# Patient Record
Sex: Female | Born: 1955 | Hispanic: Refuse to answer
Health system: Midwestern US, Community
[De-identification: ages and names within clinical notes are randomized; demographics above are authoritative.]

## PROBLEM LIST (undated history)

## (undated) DIAGNOSIS — L409 Psoriasis, unspecified: Secondary | ICD-10-CM

## (undated) DIAGNOSIS — R32 Unspecified urinary incontinence: Secondary | ICD-10-CM

---

## 2001-10-01 ENCOUNTER — Ambulatory Visit (HOSPITAL_COMMUNITY): Admission: RE | Admit: 2001-10-01 | Discharge: 2001-10-01 | Payer: Self-pay | Admitting: Obstetrics and Gynecology

## 2001-10-01 ENCOUNTER — Encounter: Payer: Self-pay | Admitting: Obstetrics and Gynecology

## 2001-12-01 ENCOUNTER — Other Ambulatory Visit: Admission: RE | Admit: 2001-12-01 | Discharge: 2001-12-01 | Payer: Self-pay | Admitting: Obstetrics and Gynecology

## 2002-04-20 ENCOUNTER — Other Ambulatory Visit: Admission: RE | Admit: 2002-04-20 | Discharge: 2002-04-20 | Payer: Self-pay | Admitting: Obstetrics and Gynecology

## 2002-10-11 ENCOUNTER — Encounter: Payer: Self-pay | Admitting: Obstetrics and Gynecology

## 2002-10-11 ENCOUNTER — Ambulatory Visit (HOSPITAL_COMMUNITY): Admission: RE | Admit: 2002-10-11 | Discharge: 2002-10-11 | Payer: Self-pay | Admitting: Obstetrics and Gynecology

## 2003-04-04 ENCOUNTER — Ambulatory Visit (HOSPITAL_COMMUNITY): Admission: RE | Admit: 2003-04-04 | Discharge: 2003-04-04 | Payer: Self-pay | Admitting: Obstetrics & Gynecology

## 2003-04-06 ENCOUNTER — Ambulatory Visit (HOSPITAL_COMMUNITY): Admission: RE | Admit: 2003-04-06 | Discharge: 2003-04-06 | Payer: Self-pay | Admitting: Obstetrics & Gynecology

## 2003-10-31 ENCOUNTER — Ambulatory Visit (HOSPITAL_COMMUNITY): Admission: RE | Admit: 2003-10-31 | Discharge: 2003-10-31 | Payer: Self-pay | Admitting: Obstetrics and Gynecology

## 2004-01-22 ENCOUNTER — Encounter: Admission: RE | Admit: 2004-01-22 | Discharge: 2004-01-22 | Payer: Self-pay | Admitting: Surgery

## 2004-12-10 ENCOUNTER — Ambulatory Visit (HOSPITAL_COMMUNITY): Admission: RE | Admit: 2004-12-10 | Discharge: 2004-12-10 | Payer: Self-pay | Admitting: Obstetrics and Gynecology

## 2006-02-17 ENCOUNTER — Ambulatory Visit (HOSPITAL_COMMUNITY): Admission: RE | Admit: 2006-02-17 | Discharge: 2006-02-17 | Payer: Self-pay | Admitting: Obstetrics and Gynecology

## 2006-10-27 ENCOUNTER — Encounter (INDEPENDENT_AMBULATORY_CARE_PROVIDER_SITE_OTHER): Payer: Self-pay | Admitting: Specialist

## 2006-10-27 ENCOUNTER — Ambulatory Visit (HOSPITAL_COMMUNITY): Admission: RE | Admit: 2006-10-27 | Discharge: 2006-10-27 | Payer: Self-pay | Admitting: General Surgery

## 2007-03-08 ENCOUNTER — Ambulatory Visit (HOSPITAL_COMMUNITY): Admission: RE | Admit: 2007-03-08 | Discharge: 2007-03-08 | Payer: Self-pay | Admitting: Obstetrics and Gynecology

## 2007-09-09 HISTORY — PX: HERNIA REPAIR: SHX51

## 2008-03-27 ENCOUNTER — Other Ambulatory Visit: Admission: RE | Admit: 2008-03-27 | Discharge: 2008-03-27 | Payer: Self-pay | Admitting: Obstetrics & Gynecology

## 2008-06-12 ENCOUNTER — Ambulatory Visit (HOSPITAL_COMMUNITY): Admission: RE | Admit: 2008-06-12 | Discharge: 2008-06-12 | Payer: Self-pay | Admitting: Obstetrics and Gynecology

## 2009-09-05 ENCOUNTER — Ambulatory Visit (HOSPITAL_COMMUNITY): Admission: RE | Admit: 2009-09-05 | Discharge: 2009-09-05 | Payer: Self-pay | Admitting: Obstetrics and Gynecology

## 2011-01-24 NOTE — H&P (Signed)
   Dana Clark, Dana Clark                            ACCOUNT NO.:  [id number]   MEDICAL RECORD NO.:  [id number]                   PATIENT TYPE:  AMB   LOCATION:  DAY                                  FACILITY:  [facility]   PHYSICIAN:  Lazaro Arms, M.D.                DATE OF BIRTH:  April 27, 1956   DATE OF ADMISSION:  04/04/2003  DATE OF DISCHARGE:                                HISTORY & PHYSICAL   HISTORY OF PRESENT ILLNESS:  The patient is a 55 year old white female  gravida 4, para 3, abortus 1 with last menstrual period of March 16, 2003 who  is admitted for hysteroscopy, D&C, and endometrial ablation because of  really heavy menstrual periods. Typically days 2 and 3 they are very heavy  with lots of clots. She uses tampons and pads together. She soils her  clothes and her sheets. Does not have much cramping with it but really can  not even go out of the house well. We discussed treatment options but at her  age, I was uncomfortable and she was uncomfortable with oral contraceptive  pills and we have admitted her for hysteroscopy, D&C, and ablation.   PAST MEDICAL HISTORY:  Negative.   PAST SURGICAL HISTORY:  Left shoulder.   PAST OBSTETRIC HISTORY:  Three vaginal deliveries and 1 miscarriage.   REVIEW OF SYSTEMS:  Otherwise negative.   PHYSICAL EXAMINATION:  VITAL SIGNS: Weight is 127 pounds. Hemoglobin 11.4.  Blood pressure is 100/78.  HEENT: Unremarkable.  NECK: Thyroid is normal.  LUNGS: Clear.  HEART: Regular rate and rhythm. Without murmur, rub, or gallop.  BREAST: Without mass, discharge, or skin changes.  ABDOMEN: Benign. No hepatosplenomegaly or masses.  PELVIC: She has normal external genitalia. Vagina clean without discharge.  Cervix parous __________ lesions. Uterus is normal size, shape, and contour.  Nontender. The ovaries normal size and nontender.   IMPRESSION:  Hypermenorrhea and menometrorrhagia.   PLAN:  The patient is admitted for hysteroscopy, D&C, and  endometrial  ablation. She understands the risks, benefits, indications and alternatives  and will proceed.                                               Lazaro Arms, M.D.    [name]  D:  04/05/2003  T:  04/05/2003  Job:  [id number]

## 2011-01-24 NOTE — Op Note (Signed)
NAMEEMBERLI, BALLESTER                  ACCOUNT NO.:  [id number]   MEDICAL RECORD NO.:  [id number]          PATIENT TYPE:  AMB   LOCATION:  DAY                          FACILITY:  [facility]   PHYSICIAN:  Ollen Gross. Vernell Morgans, M.D. DATE OF BIRTH:  [birth year]   DATE OF PROCEDURE:  10/27/2006  DATE OF DISCHARGE:                               OPERATIVE REPORT   PREOPERATIVE DIAGNOSIS:  Left inguinal hernia.   POSTOPERATIVE DIAGNOSIS:  Left indirect inguinal hernia.   PROCEDURE:  Left inguinal hernia repair with mesh.   SURGEON:  Ollen Gross. Vernell Morgans, M.D.   ANESTHESIA:  General via LMA.   PROCEDURE:  After informed consent was obtained, the patient was brought  to the operating room, placed in supine position on the operating table.  After adequate induction of general anesthesia, the patient's left groin  was infiltrated with 0.25% Marcaine.  A small incision was made from the  edge of the pubic tubercle on the left towards the anterior superior  iliac spine.  This incision was carried down through the skin and  subcutaneous tissue sharply with electrocautery until the fascia of the  external oblique was encountered.  The small bridging vein was clamped  with hemostats, divided and ligated with 3-0 silk ties.  The external  oblique fascia was opened along its fibers towards the apex of the  external ring with a 15 blade knife and Metzenbaum scissors.  Blunt  dissection was carried out of the round ligament until it could be  surrounded between two fingers.  The round ligament was gently  skeletonized and a hernia sac was identified with the round ligament.  It was gently separated from the round ligament with a blunt hemostat  dissection and some sharp dissection with electrocautery.  The sac was  then opened near its base.  There were no visceral contents within the  sac.  The sac was closed with a running 2-0 silk stitch and was allowed  to retract back beneath the transversalis muscular layer.   The distal  sac was sent to pathology the ilioinguinal nerve was also identified and  was clamped proximally and distally, divided and ligated with 3-0 silk  ties.  Next a piece of Parietex 3 x 6 mesh was chosen and cut to fit.  The mesh was sewed inferiorly to the shelving edge of inguinal ligament  with a running 2-0 Prolene stitch.  The tails were cut in the mesh  laterally and the tails were wrapped around the cord structures  superiorly.  The mesh was sewed to the muscular aponeurotic strength  layer of the transversalis with interrupted 2-0 Prolene vertical  mattress stitches.  The tails were anchored lateral to the round  ligament to the shelving edge of inguinal ligament with an interrupted 2-  0 Prolene stitch.  Once this was accomplished, the mesh appeared to be  in good position without any tension.  The wound was irrigated with  copious amounts of saline.  The external oblique was then reapproximated  with a running 2-0 Vicryl stitch.  The wound was  infiltrated with more  quarter percent Marcaine.  The subcutaneous fascia was closed with  running 3-0 Vicryl stitch  and the skin was closed with a running 4-0 Monocryl subcuticular stitch.  Benzoin, Steri-Strips and sterile dressings were applied.  The patient  tolerated procedure well.  At the end of the case, all needle, sponge  and instrument counts were correct.  The patient was awake and taken to  recovery in stable condition.      Ollen Gross. Vernell Morgans, M.D.  Electronically Signed     PST/MEDQ  D:  10/27/2006  T:  10/27/2006  Job:  [id number]

## 2011-01-24 NOTE — Op Note (Signed)
   Dana Clark, Dana Clark                            ACCOUNT NO.:  [id number]   MEDICAL RECORD NO.:  [id number]                   PATIENT TYPE:  AMB   LOCATION:  DAY                                  FACILITY:  [facility]   PHYSICIAN:  Lazaro Arms, M.D.                DATE OF BIRTH:  15-Oct-1955   DATE OF PROCEDURE:  04/06/2003  DATE OF DISCHARGE:                                 OPERATIVE REPORT   PREOPERATIVE DIAGNOSES:  1. Hypermenorrhea.  2. Menometrorrhagia.   POSTOPERATIVE DIAGNOSES:  1. Hypermenorrhea.  2. Menometrorrhagia.   PROCEDURE:  Hysteroscopy, dilatation and curettage, with endometrial  ablation.   SURGEON:  Lazaro Arms, M.D.   ANESTHESIA:  General endotracheal.   FINDINGS:  The patient had a relatively normal-appearing endometrium.  She  had no polyps.  I noted no submucosal myomas.  She did have a heavy growth  of endometrial tissue.   DESCRIPTION OF OPERATION:  The patient was taken to the operating room and  placed in the supine position, where she underwent general endotracheal  anesthesia.  She was then placed in the dorsal lithotomy position and  prepped and draped in the usual sterile fashion, and her bladder was  drained.  A speculum was placed.  Her cervix was grasped.  Paracervical  block using 0.5% Marcaine with 1:200,000 epinephrine was placed, and the  uterus sounded to 9 cm.  The cervix was dilated serially to allow passage of  the hysteroscope.  A hysteroscopy was performed, a diagnostic hysteroscopy  performed in the usual fashion.  The tubal ostia were seen.  The anterior  and posterior walls and the fundal portion of the uterus were seen well with  no abnormalities.  A vigorous uterine curettage was performed and a good  uterine cry in all areas. The ThermaChoice endometrial ablation was then  performed in the usual fashion.  It took 23 mL of D5W to inflate the balloon  of a pressure of around 190 mmHg.  It was heated to 87 degrees Celsius or  180 degrees Fahrenheit, and this pressure was maintained throughout and all  the fluid was recovered at the end of the procedure.  A total therapy time  of 9 minutes 40 seconds was done.  The patient was awakened from anesthesia  and taken to the recovery room in good, stable condition.  All counts  correct.                                               Lazaro Arms, M.D.    [name]  D:  04/06/2003  T:  04/06/2003  Job:  [id number]

## 2011-02-07 ENCOUNTER — Other Ambulatory Visit: Payer: Self-pay | Admitting: Dermatology

## 2012-06-14 ENCOUNTER — Other Ambulatory Visit: Payer: Self-pay | Admitting: Dermatology

## 2012-10-27 ENCOUNTER — Other Ambulatory Visit: Payer: Self-pay | Admitting: Obstetrics and Gynecology

## 2012-10-27 ENCOUNTER — Other Ambulatory Visit (HOSPITAL_COMMUNITY)
Admission: RE | Admit: 2012-10-27 | Discharge: 2012-10-27 | Disposition: A | Discharge status: Home / Self Care | Payer: BC Managed Care – PPO | Source: Ambulatory Visit | Attending: Obstetrics and Gynecology | Admitting: Obstetrics and Gynecology

## 2012-10-27 DIAGNOSIS — Z01419 Encounter for gynecological examination (general) (routine) without abnormal findings: Secondary | ICD-10-CM | POA: Insufficient documentation

## 2012-10-27 DIAGNOSIS — Z1151 Encounter for screening for human papillomavirus (HPV): Secondary | ICD-10-CM | POA: Insufficient documentation

## 2012-12-15 ENCOUNTER — Encounter: Payer: Self-pay | Admitting: Obstetrics and Gynecology

## 2013-06-13 ENCOUNTER — Other Ambulatory Visit: Payer: Self-pay | Admitting: Obstetrics and Gynecology

## 2013-06-13 DIAGNOSIS — Z139 Encounter for screening, unspecified: Secondary | ICD-10-CM

## 2013-06-27 ENCOUNTER — Ambulatory Visit (HOSPITAL_COMMUNITY)
Admission: RE | Admit: 2013-06-27 | Discharge: 2013-06-27 | Disposition: A | Discharge status: Home / Self Care | Payer: BC Managed Care – PPO | Source: Ambulatory Visit | Attending: Obstetrics and Gynecology | Admitting: Obstetrics and Gynecology

## 2013-06-27 DIAGNOSIS — Z139 Encounter for screening, unspecified: Secondary | ICD-10-CM

## 2013-06-27 DIAGNOSIS — Z1231 Encounter for screening mammogram for malignant neoplasm of breast: Secondary | ICD-10-CM | POA: Insufficient documentation

## 2013-07-04 ENCOUNTER — Other Ambulatory Visit: Payer: Self-pay | Admitting: Obstetrics and Gynecology

## 2013-07-04 DIAGNOSIS — R921 Mammographic calcification found on diagnostic imaging of breast: Secondary | ICD-10-CM

## 2013-07-06 ENCOUNTER — Ambulatory Visit (HOSPITAL_COMMUNITY)
Admission: RE | Admit: 2013-07-06 | Discharge: 2013-07-06 | Disposition: A | Discharge status: Home / Self Care | Payer: BC Managed Care – PPO | Source: Ambulatory Visit | Attending: Obstetrics and Gynecology | Admitting: Obstetrics and Gynecology

## 2013-07-06 ENCOUNTER — Encounter (HOSPITAL_COMMUNITY): Payer: BC Managed Care – PPO

## 2013-07-06 ENCOUNTER — Other Ambulatory Visit: Payer: Self-pay | Admitting: Obstetrics and Gynecology

## 2013-07-06 DIAGNOSIS — R921 Mammographic calcification found on diagnostic imaging of breast: Secondary | ICD-10-CM

## 2013-07-06 DIAGNOSIS — R928 Other abnormal and inconclusive findings on diagnostic imaging of breast: Secondary | ICD-10-CM | POA: Insufficient documentation

## 2013-07-11 ENCOUNTER — Ambulatory Visit
Admission: RE | Admit: 2013-07-11 | Discharge: 2013-07-11 | Disposition: A | Discharge status: Home / Self Care | Payer: BC Managed Care – PPO | Source: Ambulatory Visit | Attending: Obstetrics and Gynecology | Admitting: Obstetrics and Gynecology

## 2013-07-11 ENCOUNTER — Telehealth: Payer: Self-pay | Admitting: Adult Health

## 2013-07-11 DIAGNOSIS — R921 Mammographic calcification found on diagnostic imaging of breast: Secondary | ICD-10-CM

## 2013-07-11 NOTE — Telephone Encounter (Signed)
Dana Clark had breast biopsy today, off her hormones having headache, will stay off hormones til gets results.she has lots of questions, told her to wait til results back, then can answer better.

## 2013-07-12 ENCOUNTER — Telehealth: Payer: Self-pay | Admitting: Obstetrics and Gynecology

## 2013-07-12 NOTE — Telephone Encounter (Signed)
Patient's questions answered.  She has appt with Dr Carolynne Edouard later this week for excisional biopsy.

## 2013-07-26 ENCOUNTER — Encounter (INDEPENDENT_AMBULATORY_CARE_PROVIDER_SITE_OTHER): Payer: Self-pay | Admitting: General Surgery

## 2013-07-26 ENCOUNTER — Encounter (INDEPENDENT_AMBULATORY_CARE_PROVIDER_SITE_OTHER): Payer: Self-pay

## 2013-07-26 ENCOUNTER — Ambulatory Visit (INDEPENDENT_AMBULATORY_CARE_PROVIDER_SITE_OTHER): Payer: BC Managed Care – PPO | Admitting: General Surgery

## 2013-07-26 VITALS — BP 104/62 | HR 76 | Resp 16 | Ht 67.0 in | Wt 129.0 lb

## 2013-07-26 DIAGNOSIS — N6099 Unspecified benign mammary dysplasia of unspecified breast: Secondary | ICD-10-CM | POA: Insufficient documentation

## 2013-07-26 DIAGNOSIS — N6089 Other benign mammary dysplasias of unspecified breast: Secondary | ICD-10-CM

## 2013-07-27 ENCOUNTER — Encounter (INDEPENDENT_AMBULATORY_CARE_PROVIDER_SITE_OTHER): Payer: Self-pay | Admitting: General Surgery

## 2013-07-27 NOTE — Progress Notes (Signed)
Patient ID: Dana Clark, female   DOB: 1956-04-25, 57 y.o.   MRN: [id number]  Chief Complaint  Patient presents with  . Breast Mass    HPI Dana Clark is a 57 y.o. female.  We are asked to see the pt in consultation by Dr. Si Gaul to evaluate her for abnormal calcifications in the left breast. The pt is a 57yo wf who recently went for a routine screening mammogram. At that time she was found to have abnormal appearing calcs in the lateral aspect of the left breast measuring about 5cm. 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 Prior to her biopsy she denied breast pain or discharge from her nipple. She did take estrogen and progesterone for a while but is now off this. Her only family history of breast cancer is in a maternal grandmother.  HPI  No past medical history on file.  Past Surgical History  Procedure Laterality Date  . Hernia repair  2009    Family History  Problem Relation Age of Onset  . Breast cancer Maternal Grandmother     Social History History  Substance Use Topics  . Smoking status: Never Smoker   . Smokeless tobacco: Not on file  . Alcohol Use: Yes     Comment: socially    Allergies  Allergen Reactions  . Codeine Nausea Only    "pain medications"  . Sulfur     Current Outpatient Prescriptions  Medication Sig Dispense Refill  . amphetamine-dextroamphetamine (ADDERALL) 20 MG tablet       . UNABLE TO FIND Med Name: Vemma - vitamin 6 0z daily       No current facility-administered medications for this visit.    Review of Systems Review of Systems  Constitutional: Negative.   HENT: Negative.   Eyes: Negative.   Respiratory: Negative.   Cardiovascular: Negative.   Gastrointestinal: Negative.   Endocrine: Negative.   Genitourinary: Negative.   Musculoskeletal: Negative.   Skin: Negative.   Allergic/Immunologic: Negative.   Neurological: Negative.   Hematological: Negative.   Psychiatric/Behavioral:  Negative.     Blood pressure 104/62, pulse 76, resp. rate 16, height 5\' 7"  (1.702 m), weight 129 lb (58.514 kg).  Physical Exam Physical Exam  Constitutional: She is oriented to person, place, and time. She appears well-developed and well-nourished.  HENT:  Head: Normocephalic and atraumatic.  Eyes: Conjunctivae and EOM are normal. Pupils are equal, round, and reactive to light.  Neck: Normal range of motion. Neck supple.  Cardiovascular: Normal rate, regular rhythm and normal heart sounds.   Pulmonary/Chest: Effort normal and breath sounds normal.  There is a palpable bruise and hematoma in the lateral left breast. Otherwise there is no other palpable mass in either breast. There is no palpable axillary, supraclavicular, or cervical lymphadenopathy  Abdominal: Soft. Bowel sounds are normal. She exhibits no mass. There is no tenderness.  Musculoskeletal: Normal range of motion.  Lymphadenopathy:    She has no cervical adenopathy.  Neurological: She is alert and oriented to person, place, and time.  Skin: Skin is warm and dry.  Psychiatric: She has a normal mood and affect. Her behavior is normal.    Data Reviewed As above  Assessment    The pt has high risk findings in a large area of calcification in the lateral left breast. I do not think we can remove all the calcifications without doing a mastectomy. The question is how suspicious do the calcs  appear and if any are left after a lumpectomy are they going to cause concern and further biopsies to be pursued. I have discussed the different options for management including breast conservation vs mastectomy. She is not sure what she wants to do yet.     Plan    I will plan to review the mammograms with the radiologist and then talk to the pt again to see how she wants to proceed        TOTH III,PAUL S 07/27/2013, 9:20 AM

## 2013-08-03 ENCOUNTER — Other Ambulatory Visit (INDEPENDENT_AMBULATORY_CARE_PROVIDER_SITE_OTHER): Payer: Self-pay | Admitting: General Surgery

## 2013-08-03 DIAGNOSIS — N6099 Unspecified benign mammary dysplasia of unspecified breast: Secondary | ICD-10-CM

## 2013-08-13 ENCOUNTER — Other Ambulatory Visit: Payer: BC Managed Care – PPO

## 2014-07-12 ENCOUNTER — Ambulatory Visit: Payer: Self-pay | Admitting: Obstetrics & Gynecology

## 2015-06-06 ENCOUNTER — Other Ambulatory Visit (HOSPITAL_COMMUNITY)
Admission: RE | Admit: 2015-06-06 | Discharge: 2015-06-06 | Disposition: A | Discharge status: Home / Self Care | Payer: PRIVATE HEALTH INSURANCE | Source: Ambulatory Visit | Attending: Obstetrics and Gynecology | Admitting: Obstetrics and Gynecology

## 2015-06-06 ENCOUNTER — Encounter: Payer: Self-pay | Admitting: Obstetrics and Gynecology

## 2015-06-06 ENCOUNTER — Ambulatory Visit (INDEPENDENT_AMBULATORY_CARE_PROVIDER_SITE_OTHER): Payer: PRIVATE HEALTH INSURANCE | Admitting: Obstetrics and Gynecology

## 2015-06-06 VITALS — BP 110/66 | Ht 66.0 in | Wt 135.0 lb

## 2015-06-06 DIAGNOSIS — Z01419 Encounter for gynecological examination (general) (routine) without abnormal findings: Secondary | ICD-10-CM

## 2015-06-06 DIAGNOSIS — Z1151 Encounter for screening for human papillomavirus (HPV): Secondary | ICD-10-CM | POA: Insufficient documentation

## 2015-06-06 NOTE — Progress Notes (Signed)
Patient ID: Dana Clark, female   DOB: 16-Apr-1956, 59 y.o.   MRN: [id number]  By signing my name below, I, Jarvis Morgan, attest that this documentation has been prepared under the direction and in the presence of Tilda Burrow, MD. Electronically Signed: Jarvis Morgan, ED Scribe. 06/06/2015. 12:31 PM.  Assessment:  Annual Gyn Exam a   Plan:  1. pap smear done, next pap due in 3 years 2. return annually or prn 3    Annual mammogram advised Subjective:  Dana Clark is a 59 y.o. female No obstetric history on file. who presents for annual exam. No LMP recorded. Patient is postmenopausal. . Pt with multiple concerns, will reschedule an appt just to review concerns.   The following portions of the patient's history were reviewed and updated as appropriate: allergies, current medications, past family history, past medical history, past social history, past surgical history and problem list. History reviewed. No pertinent past medical history.  Past Surgical History  Procedure Laterality Date  . Hernia repair  2009     Current outpatient prescriptions:  .  amphetamine-dextroamphetamine (ADDERALL) 20 MG tablet, , Disp: , Rfl:  .  UNABLE TO FIND, Med Name: Vemma - vitamin 6 0z daily, Disp: , Rfl:   Review of Systems Constitutional: negative Gastrointestinal: negative Genitourinary: negative  Objective:  BP 110/66 mmHg  Ht  (1.676 m)  Wt 135 lb (61.236 kg)  BMI 21.80 kg/m2   BMI: Body mass index is 21.8 kg/(m^2).  General Appearance: Alert, appropriate appearance for age. No acute distress Breast Exam: No dimpling, nipple retraction or discharge. No masses or nodes. Gastrointestinal: Soft, non-tender, no masses or organomegaly Pelvic Exam: Vulva and vagina appear normal. Bimanual exam reveals normal uterus and adnexa. Atrophic vaginal tissue Rectovaginal: normal rectal, no masses and guaiac negative stool obtained. Irritation between gluteal creases along the  midline. Skin: no rash or abnormalities Neurologic: Normal gait and speech, no tremor  Psychiatric: Alert and oriented, appropriate affect.  Urinalysis:Not done  Christin Bach. MD Pgr [phone] 12:31 PM   I personally performed the services described in this documentation, which was SCRIBED in my presence. The recorded information has been reviewed and considered accurate. It has been edited as necessary during review. Tilda Burrow, MD

## 2015-06-06 NOTE — Progress Notes (Signed)
Patient ID: Dana Clark, female   DOB: 06-Sep-1956, 59 y.o.   MRN: [id number] Pt here today for annual exam.

## 2015-06-07 LAB — CYTOLOGY - PAP

## 2015-06-20 ENCOUNTER — Encounter: Payer: Self-pay | Admitting: Obstetrics and Gynecology

## 2015-06-20 ENCOUNTER — Ambulatory Visit (INDEPENDENT_AMBULATORY_CARE_PROVIDER_SITE_OTHER): Payer: PRIVATE HEALTH INSURANCE | Admitting: Obstetrics and Gynecology

## 2015-06-20 VITALS — BP 120/64 | Wt 137.0 lb

## 2015-06-20 DIAGNOSIS — Z853 Personal history of malignant neoplasm of breast: Secondary | ICD-10-CM

## 2015-06-20 DIAGNOSIS — N951 Menopausal and female climacteric states: Secondary | ICD-10-CM | POA: Diagnosis not present

## 2015-06-20 DIAGNOSIS — K602 Anal fissure, unspecified: Secondary | ICD-10-CM

## 2015-06-20 NOTE — Progress Notes (Signed)
   [facility]  Patient name: Gentry Fitzmy M Dossett MRN [id number]  Date of birth: [birth year]  CC & HPI:  Justina Franchot ErichsenM Sanmiguel is a 59 y.o. female presenting today for advice and further discussion of her appt last week. She was given proctosone cream to use as needed for her perianal fissures which she states has been provided relief. She endorses she needs to have a colonoscopy and needs to schedule that. Pt also reports she is having hot flashes but due to her h/o breast cancer she can not be on certain estrogen treatments; she would like to talk about possible treatments for this.   ROS:  10 Systems reviewed and all are negative for acute change except as noted in the HPI.   Pertinent History Reviewed:   Reviewed: Significant for no PMHx Medical        History reviewed. No pertinent past medical history.                            Surgical Hx:    Past Surgical History  Procedure Laterality Date  . Hernia repair  2009   Medications: Reviewed & Updated - see associated section                       Current outpatient prescriptions:  .  amphetamine-dextroamphetamine (ADDERALL) 20 MG tablet, , Disp: , Rfl:  .  UNABLE TO FIND, Med Name: Vemma - vitamin 6 0z daily, Disp: , Rfl:    Social History: Reviewed -  reports that she has never smoked. She has never used smokeless tobacco.  Objective Findings:  Vitals: Blood pressure 120/64, weight 137 lb (62.143 kg).  Physical Examination: not done discussion only   Assessment & Plan:   A:  1. Perianal fissures 2. Vasomotor systems   P:  1. Continue current treatment with 2.5 hydrocortisone prn. Colonoscopy advised 2. Continue Blackcohash, consider starting Effexor 375 prn  By signing my name below, I, Jarvis Morganaylor Raziel Koenigs, attest that this documentation has been prepared under the direction and in the presence of Tilda BurrowJohn Ambar Raphael V, MD. Electronically Signed: Jarvis Morganaylor Montay Vanvoorhis, ED Scribe. 06/20/2015. 11:12 AM.  I personally performed the  services described in this documentation, which was SCRIBED in my presence. The recorded information has been reviewed and considered accurate. It has been edited as necessary during review. Tilda BurrowFERGUSON,Keean Wilmeth V, MD  Over 25 minutes spent in direct pt contact, answering her lengthy questions in detail , to apparent pt satisfaction .

## 2015-07-03 ENCOUNTER — Encounter: Payer: Self-pay | Admitting: Obstetrics and Gynecology

## 2015-07-16 ENCOUNTER — Telehealth: Payer: Self-pay | Admitting: Obstetrics and Gynecology

## 2015-07-17 ENCOUNTER — Other Ambulatory Visit: Payer: Self-pay | Admitting: Obstetrics and Gynecology

## 2015-07-17 DIAGNOSIS — N951 Menopausal and female climacteric states: Secondary | ICD-10-CM | POA: Insufficient documentation

## 2015-07-17 MED ORDER — VENLAFAXINE HCL ER 37.5 MG PO CP24: 37.5000 mg | ORAL_CAPSULE | Freq: Every day | ORAL | Status: DC

## 2015-07-17 NOTE — Telephone Encounter (Signed)
Pt aware that Rx has been faxed to her pharmacy.

## 2015-08-04 IMAGING — MG MM LT BREAST BX W LOC DEV 1ST LESION IMAGE BX SPEC STEREO GUIDE
3 series · 3 of 3 positions shown · non-contrast
Comparison: Previous exams.

CLINICAL DATA: 57-year-old female for tissue sampling of
indeterminate calcifications in the outer left breast.

EXAM:
STEREOTACTIC CORE NEEDLE BIOPSY OF THE LEFT BREAST

[L CC]
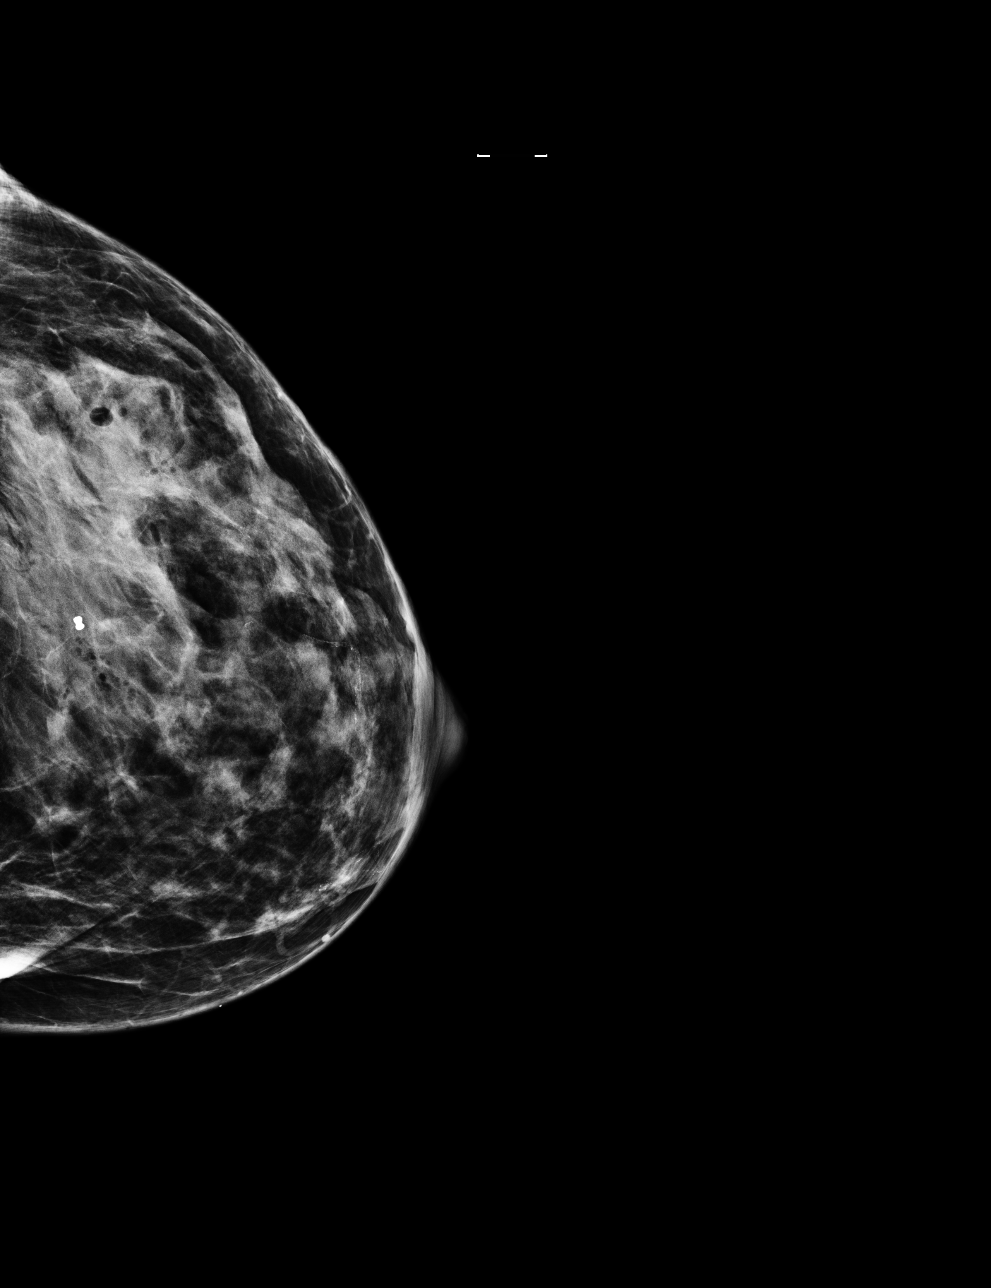

[L SPECIMEN]
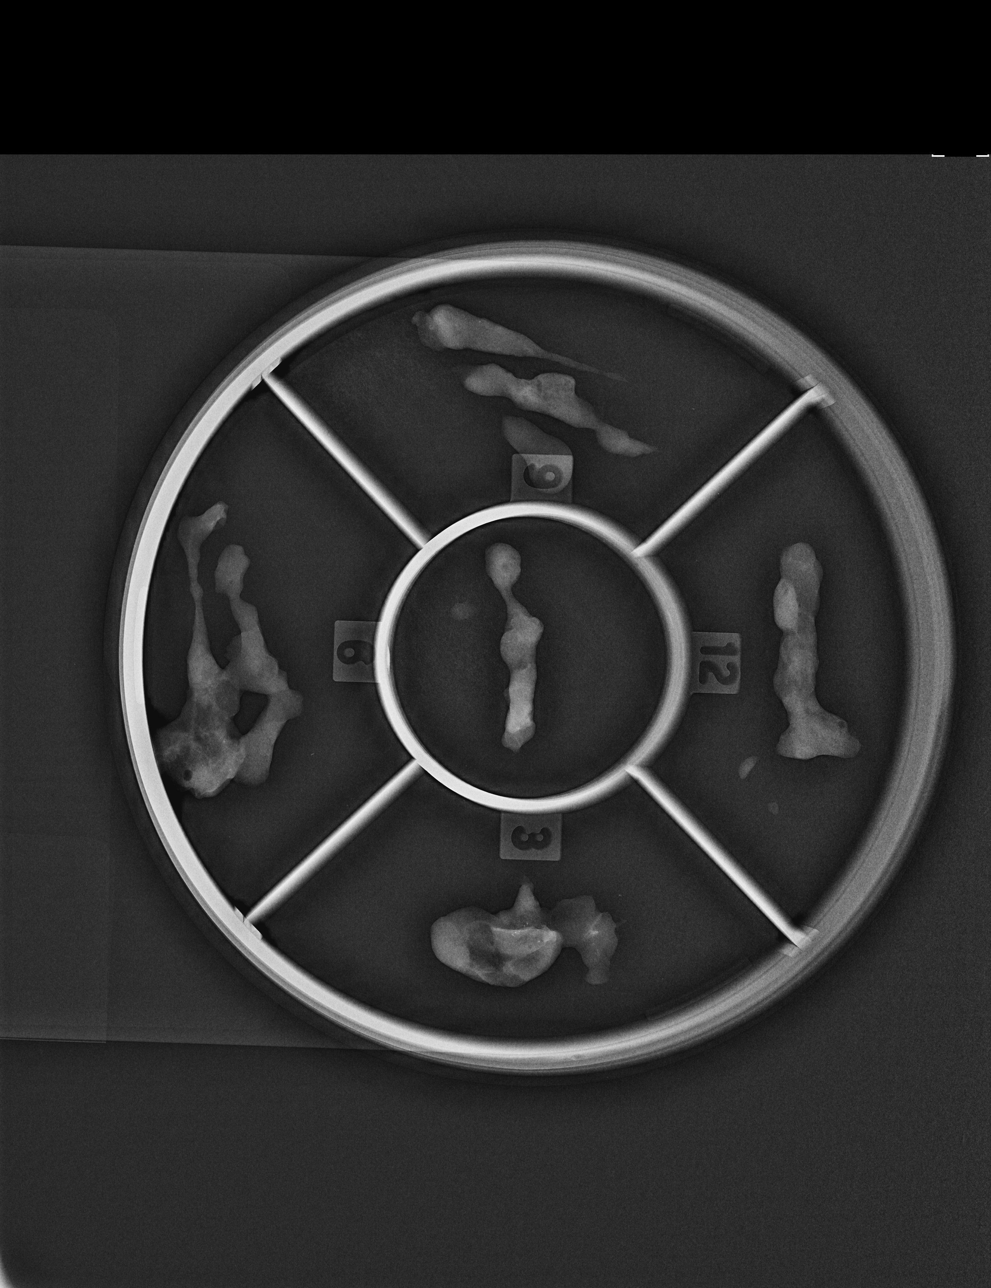

[L ML]
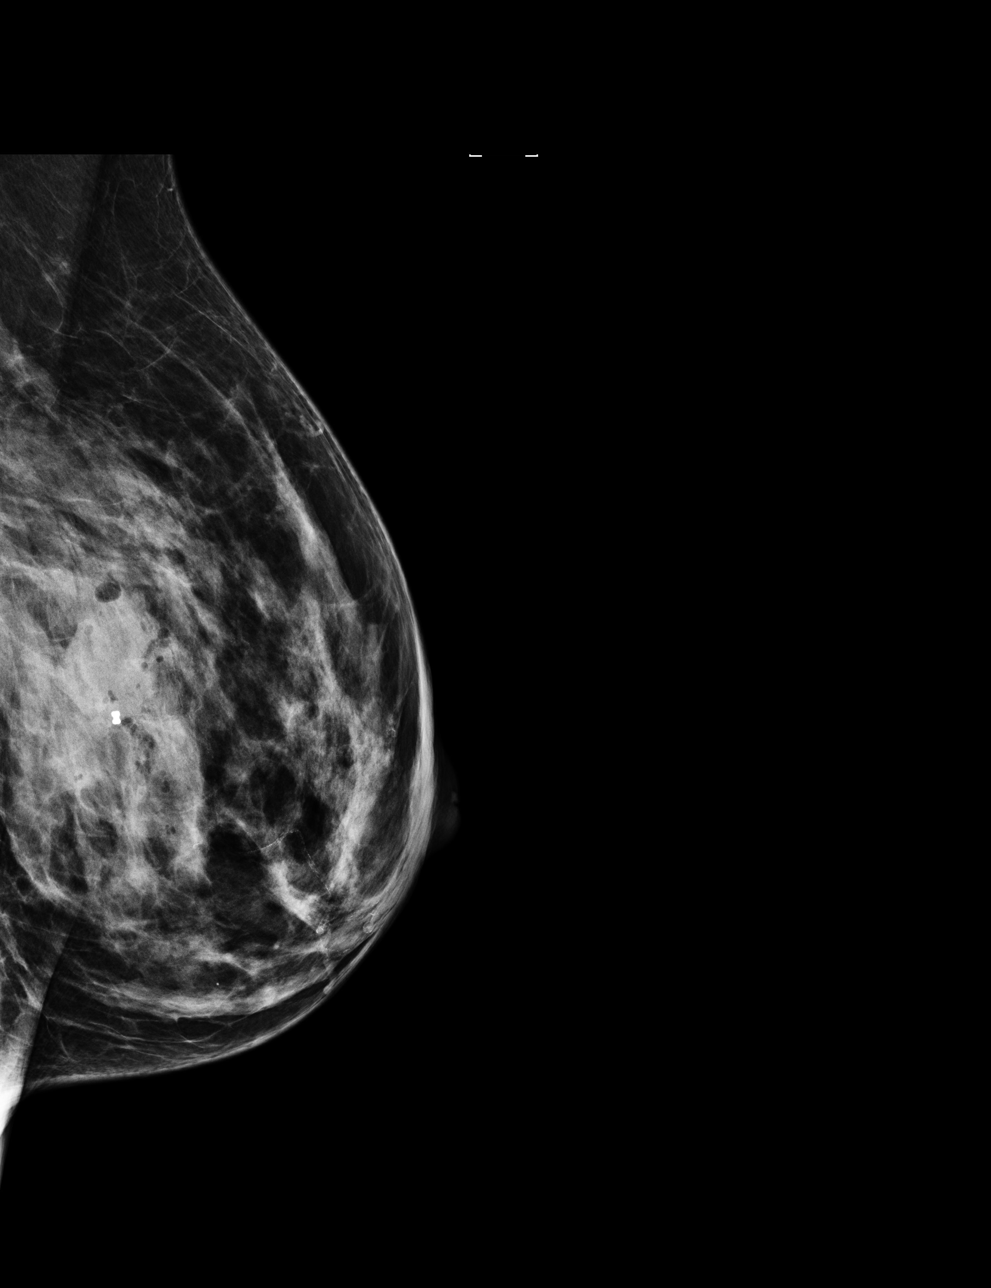

[3 of 3 positions shown; findings below may reference images not displayed]

FINDINGS: I met with the patient and we discussed the procedure of
stereotactic-guided biopsy, including benefits and alternatives. We
discussed the high likelihood of a successful procedure. We
discussed the risks of the procedure, including infection, bleeding,
tissue injury, clip migration, and inadequate sampling. Informed,
written consent was given. The usual time out protocol was performed
immediately prior to the procedure.

Using sterile technique and 2% Lidocaine as local anesthetic, under
stereotactic guidance, a 12 gauge vacuum assisted needle device was
used to perform core needle biopsy of calcifications in the outer
left breast using a lateral approach. Specimen radiograph was
performed, showing calcifications. Specimens with calcifications are
identified for pathology.

At the conclusion of the procedure, a hourglass shaped tissue marker
clip was deployed into the biopsy cavity. Follow-up 2-view mammogram
demonstrates clip migration, located 3.5 cm medial to the biopsy
cavity..

Mild to moderate post biopsy hemorrhage is noted. No external
bleeding was identified when the patient left the office.
IMPRESSION: Stereotactic-guided biopsy of left breast calcifications.

Please note clip migration, located 3.5 cm medial to the biopsy
cavity.

Mild to moderate post biopsy hemorrhage.

Final pathology demonstrates COMPLEX SCLEROSING LESION AND ADH.

Histology correlates with imaging findings.

The patient was contacted by phone on 07/12/2013 and these results
given to her which she understood. Her questions were answered.

The patient described mild soreness and minimal swelling at the
biopsy site, not significantly changed since the biopsy. She was
instructed to call me or The [REDACTED] if any increase in
swelling or pain occurs.

Recommend surgery consultation for excision.. Please note that there
are additional lateral left breast calcifications that were not
biopsied and will also need to be localized prior to excision.

The patient desires to see Dr. Manfred Karinandjo ([REDACTED]), who
she has seen in the past. An appointment with Dr. Manfred Karinandjo has been
scheduled for 07/26/2013 and the patient informed.
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
## 2020-07-12 ENCOUNTER — Telehealth: Payer: Self-pay
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
## 2020-07-16 ENCOUNTER — Ambulatory Visit (INDEPENDENT_AMBULATORY_CARE_PROVIDER_SITE_OTHER): Payer: BLUE CROSS/BLUE SHIELD | Admitting: Family Medicine

## 2020-07-16 ENCOUNTER — Encounter: Payer: Self-pay | Admitting: Family Medicine

## 2020-07-16 VITALS — Ht 66.0 in

## 2020-07-16 DIAGNOSIS — S134XXA Sprain of ligaments of cervical spine, initial encounter: Secondary | ICD-10-CM
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
## 2020-07-16 NOTE — Patient Instructions (Addendum)
Good to see you 200mg  CoQ10 Choline 500mg  2 grams of Fish oil 2000 IU of Vitamin D See me again in 4 weeks if not better

## 2020-07-18 ENCOUNTER — Ambulatory Visit: Payer: BLUE CROSS/BLUE SHIELD | Admitting: Physical Therapy

## 2020-07-18 ENCOUNTER — Other Ambulatory Visit: Payer: Self-pay
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
## 2020-07-27 ENCOUNTER — Telehealth: Payer: Self-pay | Admitting: Family Medicine

## 2020-07-27 NOTE — Telephone Encounter (Signed)
Patient called stating that Physical Therapy told her they are not able to do anything further to help her. They suggested that she might need to see a Neurologist?  She asked if she could talk to Dr Katrinka Blazing regarding this  Please advise.

## 2020-07-30 NOTE — Telephone Encounter (Signed)
We were unable to see patient in person with patient in [place].  It appears per PT note patient suggested neuro consult.  I think options are  Come to office and see Dr. Denyse Amass or myself.  Or if there is another nuerologist closer to her we can refer her .  She needs to be seen and decide if MRi is necessary.  Per last office note (virtual) patient was making improvement.
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
## 2020-08-01 ENCOUNTER — Other Ambulatory Visit: Payer: Self-pay

## 2020-08-01 ENCOUNTER — Ambulatory Visit (INDEPENDENT_AMBULATORY_CARE_PROVIDER_SITE_OTHER): Payer: BLUE CROSS/BLUE SHIELD | Admitting: Family Medicine

## 2020-08-01 VITALS — BP 102/66 | HR 73 | Ht 66.0 in | Wt 139.4 lb

## 2020-08-01 DIAGNOSIS — F909 Attention-deficit hyperactivity disorder, unspecified type: Secondary | ICD-10-CM | POA: Diagnosis not present

## 2020-08-01 DIAGNOSIS — S060X0A Concussion without loss of consciousness, initial encounter: Secondary | ICD-10-CM

## 2020-08-01 MED ORDER — AMPHETAMINE-DEXTROAMPHETAMINE 10 MG PO TABS: 10.0000 mg | ORAL_TABLET | Freq: Two times a day (BID) | ORAL | 0 refills | Status: DC

## 2020-08-01 NOTE — Patient Instructions (Signed)
Thank you for coming in today.  Get plenty of sleep.   Start low dose adderall.   Plan for therapy.   Recheck in about 1 month.  Recheck sooner if needed.   Let me know if your are having a problem.

## 2020-09-04 ENCOUNTER — Ambulatory Visit: Payer: BLUE CROSS/BLUE SHIELD | Admitting: Family Medicine

## 2020-09-12 DIAGNOSIS — F0781 Postconcussional syndrome: Secondary | ICD-10-CM | POA: Diagnosis not present

## 2020-09-12 DIAGNOSIS — F482 Pseudobulbar affect: Secondary | ICD-10-CM | POA: Diagnosis not present

## 2020-09-12 DIAGNOSIS — G3184 Mild cognitive impairment, so stated: Secondary | ICD-10-CM | POA: Diagnosis not present

## 2020-09-12 DIAGNOSIS — S0990XA Unspecified injury of head, initial encounter: Secondary | ICD-10-CM | POA: Diagnosis not present

## 2020-09-13 DIAGNOSIS — D529 Folate deficiency anemia, unspecified: Secondary | ICD-10-CM | POA: Diagnosis not present

## 2020-09-13 DIAGNOSIS — R413 Other amnesia: Secondary | ICD-10-CM | POA: Diagnosis not present

## 2020-09-13 DIAGNOSIS — E538 Deficiency of other specified B group vitamins: Secondary | ICD-10-CM | POA: Diagnosis not present

## 2020-09-25 DIAGNOSIS — G3184 Mild cognitive impairment, so stated: Secondary | ICD-10-CM | POA: Diagnosis not present

## 2020-10-30 DIAGNOSIS — F482 Pseudobulbar affect: Secondary | ICD-10-CM | POA: Diagnosis not present

## 2020-10-30 DIAGNOSIS — S0990XA Unspecified injury of head, initial encounter: Secondary | ICD-10-CM | POA: Diagnosis not present

## 2020-10-30 DIAGNOSIS — G3184 Mild cognitive impairment, so stated: Secondary | ICD-10-CM | POA: Diagnosis not present

## 2020-10-30 DIAGNOSIS — Z1231 Encounter for screening mammogram for malignant neoplasm of breast: Secondary | ICD-10-CM | POA: Diagnosis not present

## 2020-10-30 DIAGNOSIS — F0781 Postconcussional syndrome: Secondary | ICD-10-CM | POA: Diagnosis not present

## 2020-11-21 DIAGNOSIS — R928 Other abnormal and inconclusive findings on diagnostic imaging of breast: Secondary | ICD-10-CM | POA: Diagnosis not present

## 2020-11-21 DIAGNOSIS — R922 Inconclusive mammogram: Secondary | ICD-10-CM | POA: Diagnosis not present

## 2020-11-21 DIAGNOSIS — N6489 Other specified disorders of breast: Secondary | ICD-10-CM | POA: Diagnosis not present

## 2021-01-22 DIAGNOSIS — E531 Pyridoxine deficiency: Secondary | ICD-10-CM | POA: Diagnosis not present

## 2021-01-22 DIAGNOSIS — G589 Mononeuropathy, unspecified: Secondary | ICD-10-CM | POA: Diagnosis not present

## 2021-01-22 DIAGNOSIS — E538 Deficiency of other specified B group vitamins: Secondary | ICD-10-CM | POA: Diagnosis not present

## 2021-01-22 DIAGNOSIS — E559 Vitamin D deficiency, unspecified: Secondary | ICD-10-CM | POA: Diagnosis not present

## 2021-03-06 DIAGNOSIS — D225 Melanocytic nevi of trunk: Secondary | ICD-10-CM | POA: Diagnosis not present

## 2021-03-06 DIAGNOSIS — D485 Neoplasm of uncertain behavior of skin: Secondary | ICD-10-CM | POA: Diagnosis not present

## 2021-03-06 DIAGNOSIS — D2239 Melanocytic nevi of other parts of face: Secondary | ICD-10-CM | POA: Diagnosis not present

## 2021-03-06 DIAGNOSIS — L821 Other seborrheic keratosis: Secondary | ICD-10-CM | POA: Diagnosis not present

## 2021-04-04 DIAGNOSIS — M62830 Muscle spasm of back: Secondary | ICD-10-CM | POA: Diagnosis not present

## 2021-04-04 DIAGNOSIS — M9905 Segmental and somatic dysfunction of pelvic region: Secondary | ICD-10-CM | POA: Diagnosis not present

## 2021-04-04 DIAGNOSIS — M9903 Segmental and somatic dysfunction of lumbar region: Secondary | ICD-10-CM | POA: Diagnosis not present

## 2021-04-04 DIAGNOSIS — M62838 Other muscle spasm: Secondary | ICD-10-CM | POA: Diagnosis not present

## 2021-04-04 DIAGNOSIS — M9902 Segmental and somatic dysfunction of thoracic region: Secondary | ICD-10-CM | POA: Diagnosis not present

## 2021-04-22 DIAGNOSIS — H2513 Age-related nuclear cataract, bilateral: Secondary | ICD-10-CM | POA: Diagnosis not present

## 2021-04-22 DIAGNOSIS — H40033 Anatomical narrow angle, bilateral: Secondary | ICD-10-CM | POA: Diagnosis not present

## 2021-05-09 DIAGNOSIS — G4719 Other hypersomnia: Secondary | ICD-10-CM | POA: Diagnosis not present

## 2021-05-09 DIAGNOSIS — F0781 Postconcussional syndrome: Secondary | ICD-10-CM | POA: Diagnosis not present

## 2021-05-09 DIAGNOSIS — G3184 Mild cognitive impairment, so stated: Secondary | ICD-10-CM | POA: Diagnosis not present

## 2021-05-09 DIAGNOSIS — F482 Pseudobulbar affect: Secondary | ICD-10-CM | POA: Diagnosis not present

## 2021-05-15 DIAGNOSIS — Z9889 Other specified postprocedural states: Secondary | ICD-10-CM | POA: Diagnosis not present

## 2021-05-15 DIAGNOSIS — R32 Unspecified urinary incontinence: Secondary | ICD-10-CM | POA: Diagnosis not present

## 2021-05-15 DIAGNOSIS — L309 Dermatitis, unspecified: Secondary | ICD-10-CM | POA: Diagnosis not present

## 2021-05-15 DIAGNOSIS — F0781 Postconcussional syndrome: Secondary | ICD-10-CM | POA: Diagnosis not present

## 2021-05-18 DIAGNOSIS — R351 Nocturia: Secondary | ICD-10-CM | POA: Diagnosis not present

## 2021-05-18 DIAGNOSIS — R350 Frequency of micturition: Secondary | ICD-10-CM | POA: Diagnosis not present

## 2021-05-18 DIAGNOSIS — R300 Dysuria: Secondary | ICD-10-CM | POA: Diagnosis not present

## 2021-05-18 DIAGNOSIS — R3915 Urgency of urination: Secondary | ICD-10-CM | POA: Diagnosis not present

## 2021-05-28 DIAGNOSIS — R922 Inconclusive mammogram: Secondary | ICD-10-CM | POA: Diagnosis not present

## 2021-05-28 DIAGNOSIS — Z87898 Personal history of other specified conditions: Secondary | ICD-10-CM | POA: Diagnosis not present

## 2021-05-28 DIAGNOSIS — R921 Mammographic calcification found on diagnostic imaging of breast: Secondary | ICD-10-CM | POA: Diagnosis not present

## 2021-06-11 DIAGNOSIS — L218 Other seborrheic dermatitis: Secondary | ICD-10-CM | POA: Diagnosis not present

## 2021-06-11 DIAGNOSIS — L400 Psoriasis vulgaris: Secondary | ICD-10-CM | POA: Diagnosis not present

## 2021-06-21 DIAGNOSIS — L409 Psoriasis, unspecified: Secondary | ICD-10-CM | POA: Diagnosis not present

## 2021-06-21 DIAGNOSIS — L290 Pruritus ani: Secondary | ICD-10-CM | POA: Diagnosis not present

## 2021-06-21 DIAGNOSIS — Z86010 Personal history of colonic polyps: Secondary | ICD-10-CM | POA: Diagnosis not present

## 2021-06-27 DIAGNOSIS — R419 Unspecified symptoms and signs involving cognitive functions and awareness: Secondary | ICD-10-CM | POA: Diagnosis not present

## 2021-06-27 DIAGNOSIS — F32A Depression, unspecified: Secondary | ICD-10-CM | POA: Diagnosis not present

## 2021-06-27 DIAGNOSIS — F419 Anxiety disorder, unspecified: Secondary | ICD-10-CM | POA: Diagnosis not present

## 2021-07-03 DIAGNOSIS — F419 Anxiety disorder, unspecified: Secondary | ICD-10-CM | POA: Diagnosis not present

## 2021-07-03 DIAGNOSIS — F32A Depression, unspecified: Secondary | ICD-10-CM | POA: Diagnosis not present

## 2021-07-03 DIAGNOSIS — R419 Unspecified symptoms and signs involving cognitive functions and awareness: Secondary | ICD-10-CM | POA: Diagnosis not present

## 2021-07-11 DIAGNOSIS — R8271 Bacteriuria: Secondary | ICD-10-CM | POA: Diagnosis not present

## 2021-07-11 DIAGNOSIS — N3946 Mixed incontinence: Secondary | ICD-10-CM | POA: Diagnosis not present

## 2021-07-11 DIAGNOSIS — R350 Frequency of micturition: Secondary | ICD-10-CM | POA: Diagnosis not present

## 2021-07-11 DIAGNOSIS — R351 Nocturia: Secondary | ICD-10-CM | POA: Diagnosis not present

## 2021-07-12 DIAGNOSIS — H25013 Cortical age-related cataract, bilateral: Secondary | ICD-10-CM | POA: Diagnosis not present

## 2021-07-12 DIAGNOSIS — H2513 Age-related nuclear cataract, bilateral: Secondary | ICD-10-CM | POA: Diagnosis not present

## 2021-07-12 DIAGNOSIS — H5500 Unspecified nystagmus: Secondary | ICD-10-CM | POA: Diagnosis not present

## 2021-07-12 DIAGNOSIS — S098XXA Other specified injuries of head, initial encounter: Secondary | ICD-10-CM | POA: Diagnosis not present

## 2021-08-14 DIAGNOSIS — F0781 Postconcussional syndrome: Secondary | ICD-10-CM | POA: Diagnosis not present

## 2021-08-14 DIAGNOSIS — N3946 Mixed incontinence: Secondary | ICD-10-CM | POA: Diagnosis not present

## 2021-09-19 DIAGNOSIS — L821 Other seborrheic keratosis: Secondary | ICD-10-CM | POA: Diagnosis not present

## 2021-09-19 DIAGNOSIS — L570 Actinic keratosis: Secondary | ICD-10-CM | POA: Diagnosis not present

## 2021-09-19 DIAGNOSIS — Z85820 Personal history of malignant melanoma of skin: Secondary | ICD-10-CM | POA: Diagnosis not present

## 2021-09-19 DIAGNOSIS — D2271 Melanocytic nevi of right lower limb, including hip: Secondary | ICD-10-CM | POA: Diagnosis not present

## 2021-09-19 DIAGNOSIS — D2372 Other benign neoplasm of skin of left lower limb, including hip: Secondary | ICD-10-CM | POA: Diagnosis not present

## 2021-09-25 DIAGNOSIS — H5500 Unspecified nystagmus: Secondary | ICD-10-CM | POA: Diagnosis not present

## 2021-09-25 DIAGNOSIS — R413 Other amnesia: Secondary | ICD-10-CM | POA: Diagnosis not present

## 2021-12-16 ENCOUNTER — Other Ambulatory Visit (HOSPITAL_COMMUNITY): Payer: Self-pay | Admitting: Family Medicine

## 2021-12-16 DIAGNOSIS — Z780 Asymptomatic menopausal state: Secondary | ICD-10-CM

## 2021-12-25 ENCOUNTER — Ambulatory Visit (HOSPITAL_COMMUNITY)
Admission: RE | Admit: 2021-12-25 | Discharge: 2021-12-25 | Disposition: A | Discharge status: Home / Self Care | Payer: Medicare Other | Source: Ambulatory Visit | Attending: Family Medicine | Admitting: Family Medicine

## 2021-12-25 DIAGNOSIS — Z780 Asymptomatic menopausal state: Secondary | ICD-10-CM | POA: Insufficient documentation

## 2022-11-06 ENCOUNTER — Ambulatory Visit
Admission: EM | Admit: 2022-11-06 | Discharge: 2022-11-06 | Disposition: A | Discharge status: Home / Self Care | Payer: Medicare Other | Attending: Nurse Practitioner | Admitting: Nurse Practitioner

## 2022-11-06 ENCOUNTER — Encounter: Payer: Self-pay | Admitting: Emergency Medicine

## 2022-11-06 ENCOUNTER — Other Ambulatory Visit: Payer: Self-pay

## 2022-11-06 DIAGNOSIS — Z1152 Encounter for screening for COVID-19: Secondary | ICD-10-CM | POA: Diagnosis present

## 2022-11-06 DIAGNOSIS — J069 Acute upper respiratory infection, unspecified: Secondary | ICD-10-CM

## 2022-11-06 HISTORY — DX: Unspecified urinary incontinence: R32

## 2022-11-06 LAB — POCT INFLUENZA A/B
Influenza A, POC: NEGATIVE
Influenza B, POC: NEGATIVE

## 2022-11-06 NOTE — ED Triage Notes (Signed)
Pt reports body aches, headache, nasal drainage, sore throat, fatigue, cough, diarrhea for last few days.

## 2022-11-06 NOTE — Discharge Instructions (Signed)
You have a viral upper respiratory infection.  Symptoms should improve over the next week to 10 days.  If you develop chest pain or shortness of breath, go to the emergency room.  We have tested you today for COVID-19.  You will see the results in Mychart and we will call you with positive results.  Please stay home and isolate until you are aware of the results.    The influenza test today is negative.  Some things that can make you feel better are: - Increased rest - Increasing fluid with water/sugar free electrolytes - Acetaminophen and ibuprofen as needed for fever/pain - Salt water gargling, chloraseptic spray and throat lozenges - OTC guaifenesin (Mucinex) 600 mg twice daily - Saline sinus flushes or a neti pot - Humidifying the air
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
## 2022-11-07 LAB — SARS CORONAVIRUS 2 (TAT 6-24 HRS): SARS Coronavirus 2: NEGATIVE

## 2023-03-25 ENCOUNTER — Ambulatory Visit
Admission: EM | Admit: 2023-03-25 | Discharge: 2023-03-25 | Disposition: A | Discharge status: Home / Self Care | Payer: Medicare Other | Attending: Family Medicine | Admitting: Family Medicine

## 2023-03-25 DIAGNOSIS — B9789 Other viral agents as the cause of diseases classified elsewhere: Secondary | ICD-10-CM | POA: Insufficient documentation

## 2023-03-25 DIAGNOSIS — U071 COVID-19: Secondary | ICD-10-CM | POA: Diagnosis not present

## 2023-03-25 DIAGNOSIS — R059 Cough, unspecified: Secondary | ICD-10-CM | POA: Insufficient documentation

## 2023-03-25 DIAGNOSIS — J069 Acute upper respiratory infection, unspecified: Secondary | ICD-10-CM | POA: Diagnosis not present

## 2023-03-25 MED ORDER — PROMETHAZINE-DM 6.25-15 MG/5ML PO SYRP: 5.0000 mL | ORAL_SOLUTION | Freq: Four times a day (QID) | ORAL | 0 refills | Status: DC | PRN
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
## 2023-03-25 NOTE — Discharge Instructions (Signed)
You may try plain mucinex (blue and white package, just guaifenesin), flonase, throat lozenges, humidifiers. I have sent over a cough syrup as well. COVID results should be back tomorrow

## 2023-03-25 NOTE — ED Triage Notes (Addendum)
Pt c/o sore throat and fever , cough, headache, body aches, chills, sweats x 3 days pt was exposed to COVID

## 2023-03-26 ENCOUNTER — Telehealth (HOSPITAL_COMMUNITY): Payer: Self-pay | Admitting: Emergency Medicine

## 2023-03-26 LAB — SARS CORONAVIRUS 2 (TAT 6-24 HRS): SARS Coronavirus 2: POSITIVE — AB

## 2023-03-26 MED ORDER — NIRMATRELVIR/RITONAVIR (PAXLOVID)TABLET: 3.0000 | ORAL_TABLET | Freq: Two times a day (BID) | ORAL | 0 refills | Status: AC

## 2023-03-31 ENCOUNTER — Ambulatory Visit (HOSPITAL_COMMUNITY): Payer: Medicare Other

## 2023-04-10 ENCOUNTER — Other Ambulatory Visit: Payer: Self-pay

## 2023-04-10 ENCOUNTER — Ambulatory Visit (HOSPITAL_COMMUNITY): Payer: Medicare Other | Attending: Health Care"

## 2023-04-10 DIAGNOSIS — R29898 Other symptoms and signs involving the musculoskeletal system: Secondary | ICD-10-CM | POA: Insufficient documentation

## 2023-04-10 DIAGNOSIS — M545 Low back pain, unspecified: Secondary | ICD-10-CM | POA: Insufficient documentation

## 2023-04-10 DIAGNOSIS — R293 Abnormal posture: Secondary | ICD-10-CM | POA: Diagnosis present
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
## 2023-04-14 ENCOUNTER — Ambulatory Visit (HOSPITAL_COMMUNITY): Payer: Medicare Other | Admitting: Physical Therapy

## 2023-04-14 ENCOUNTER — Encounter (HOSPITAL_COMMUNITY): Payer: Self-pay | Admitting: Physical Therapy

## 2023-04-14 DIAGNOSIS — R293 Abnormal posture: Secondary | ICD-10-CM

## 2023-04-14 DIAGNOSIS — M545 Low back pain, unspecified: Secondary | ICD-10-CM

## 2023-04-14 DIAGNOSIS — R29898 Other symptoms and signs involving the musculoskeletal system: Secondary | ICD-10-CM
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
## 2023-04-15 ENCOUNTER — Encounter (HOSPITAL_COMMUNITY): Payer: Medicare Other

## 2023-04-20 ENCOUNTER — Ambulatory Visit (HOSPITAL_COMMUNITY): Payer: Medicare Other | Admitting: Physical Therapy

## 2023-04-20 DIAGNOSIS — R29898 Other symptoms and signs involving the musculoskeletal system: Secondary | ICD-10-CM

## 2023-04-20 DIAGNOSIS — M545 Low back pain, unspecified: Secondary | ICD-10-CM | POA: Diagnosis not present

## 2023-04-20 DIAGNOSIS — R293 Abnormal posture: Secondary | ICD-10-CM
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
## 2023-04-23 ENCOUNTER — Ambulatory Visit (HOSPITAL_COMMUNITY): Payer: Medicare Other | Admitting: Physical Therapy

## 2023-04-23 ENCOUNTER — Encounter (HOSPITAL_COMMUNITY): Payer: Self-pay | Admitting: Physical Therapy

## 2023-04-23 DIAGNOSIS — M545 Low back pain, unspecified: Secondary | ICD-10-CM

## 2023-04-23 DIAGNOSIS — R293 Abnormal posture: Secondary | ICD-10-CM

## 2023-04-23 DIAGNOSIS — R29898 Other symptoms and signs involving the musculoskeletal system: Secondary | ICD-10-CM
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
## 2023-04-23 NOTE — Patient Instructions (Signed)

## 2023-04-27 ENCOUNTER — Ambulatory Visit (HOSPITAL_COMMUNITY): Payer: Medicare Other | Admitting: Physical Therapy

## 2023-04-27 ENCOUNTER — Encounter (HOSPITAL_COMMUNITY): Payer: Self-pay | Admitting: Physical Therapy

## 2023-04-27 DIAGNOSIS — M545 Low back pain, unspecified: Secondary | ICD-10-CM | POA: Diagnosis not present

## 2023-04-27 DIAGNOSIS — R29898 Other symptoms and signs involving the musculoskeletal system: Secondary | ICD-10-CM

## 2023-04-27 DIAGNOSIS — R293 Abnormal posture: Secondary | ICD-10-CM
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
## 2023-04-29 ENCOUNTER — Ambulatory Visit (HOSPITAL_COMMUNITY): Payer: Medicare Other | Admitting: Physical Therapy

## 2023-04-29 DIAGNOSIS — R293 Abnormal posture: Secondary | ICD-10-CM

## 2023-04-29 DIAGNOSIS — M545 Low back pain, unspecified: Secondary | ICD-10-CM

## 2023-04-29 DIAGNOSIS — R29898 Other symptoms and signs involving the musculoskeletal system: Secondary | ICD-10-CM
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
## 2023-04-30 ENCOUNTER — Encounter (HOSPITAL_COMMUNITY): Payer: Medicare Other

## 2023-05-05 ENCOUNTER — Encounter (HOSPITAL_COMMUNITY): Payer: Medicare Other | Admitting: Physical Therapy

## 2023-05-07 ENCOUNTER — Encounter (HOSPITAL_COMMUNITY): Payer: Medicare Other

## 2023-05-12 ENCOUNTER — Encounter (HOSPITAL_COMMUNITY): Payer: Medicare Other | Admitting: Physical Therapy

## 2023-05-13 ENCOUNTER — Encounter (HOSPITAL_COMMUNITY): Payer: Medicare Other

## 2023-05-18 ENCOUNTER — Encounter (HOSPITAL_COMMUNITY): Payer: Medicare Other

## 2024-01-07 ENCOUNTER — Ambulatory Visit: Admission: EM | Admit: 2024-01-07 | Discharge: 2024-01-07 | Disposition: A | Discharge status: Home / Self Care

## 2024-01-07 ENCOUNTER — Encounter: Payer: Self-pay | Admitting: Emergency Medicine

## 2024-01-07 DIAGNOSIS — J0100 Acute maxillary sinusitis, unspecified: Secondary | ICD-10-CM

## 2024-01-07 HISTORY — DX: Psoriasis, unspecified: L40.9

## 2024-01-07 MED ORDER — DOXYCYCLINE HYCLATE 100 MG PO TABS: 100.0000 mg | ORAL_TABLET | Freq: Two times a day (BID) | ORAL | 0 refills | Status: AC

## 2024-01-07 NOTE — ED Triage Notes (Addendum)
 Headache since Saturday.  States she thinks she is getting a sinus infection,  feels a lot of pressure in face and around eye.  States it started out with a scratchy throat that has went away.  At home covid test was negative today.
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
## 2024-01-07 NOTE — Discharge Instructions (Addendum)
 Take medication as directed. Continue your current allergy medication regimen. Increase fluids and get plenty of rest. May take over-the-counter Tylenol as needed for pain, fever, or general discomfort. Recommend normal saline nasal spray to help with nasal congestion throughout the day. For your cough, it may be helpful to use a humidifier at bedtime during sleep. If your symptoms fail to improve with this treatment, please follow-up with your primary care physician for further evaluation. Follow-up as needed.
